# Patient Record
Sex: Male | Born: 2000 | Hispanic: Yes | Marital: Single | Smoking: Never smoker
Health system: Southern US, Community
[De-identification: ages and names within clinical notes are randomized; demographics above are authoritative.]

## PROBLEM LIST (undated history)

## (undated) HISTORY — PX: APPENDECTOMY: SHX54

---
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
## 2021-01-24 DIAGNOSIS — Z20822 Contact with and (suspected) exposure to covid-19: Secondary | ICD-10-CM | POA: Diagnosis not present

## 2024-01-07 ENCOUNTER — Ambulatory Visit
Admission: RE | Admit: 2024-01-07 | Discharge: 2024-01-07 | Disposition: A | Discharge status: Home / Self Care | Payer: Self-pay | Source: Ambulatory Visit

## 2024-01-07 VITALS — BP 111/70 | HR 62 | Temp 98.7°F | Resp 16 | Ht 67.0 in | Wt 180.0 lb

## 2024-01-07 DIAGNOSIS — L237 Allergic contact dermatitis due to plants, except food: Secondary | ICD-10-CM

## 2024-01-07 MED ORDER — TRIAMCINOLONE ACETONIDE 0.1 % EX OINT: 1.0000 | TOPICAL_OINTMENT | Freq: Two times a day (BID) | CUTANEOUS | 1 refills | Status: AC

## 2024-01-07 MED ORDER — FEXOFENADINE HCL 180 MG PO TABS: 180.0000 mg | ORAL_TABLET | Freq: Every day | ORAL | 1 refills | Status: AC

## 2024-01-07 MED ORDER — PREDNISONE 20 MG PO TABS: ORAL_TABLET | ORAL | 0 refills | Status: AC

## 2024-01-07 NOTE — ED Provider Notes (Signed)
 [facility]  Note:  This document was prepared using Dragon voice recognition software and may include unintentional dictation errors.  MRN: [id number] DOB: July 29, 2000  Subjective:   Aaron Barr is a 23 y.o. male presenting for severe pruritic rash all over body since Sunday.  Patient reports that Sunday during the day he was working on the yard and believes that he may have been exposed to poison ivy or poison oak.  Patient reports he has been using over-the-counter ointments including calamine and other over-the-counter poison ivy treatment with no improvement.  Patient reports that symptoms have continued to spread throughout the entire body.  Patient states that rash is extremely itchy but not painful.   Current Facility-Administered Medications:    hpv vaccine (GARDASIL) injection 0.5 mL, 0.5 mL, Intramuscular, Once, Jeffery, [place], PA  Current Outpatient Medications:    calamine lotion, Apply 1 Application topically as needed for itching., Disp: , Rfl:    fexofenadine  (ALLEGRA ) 180 MG tablet, Take 1 tablet (180 mg total) by mouth daily., Disp: 30 tablet, Rfl: 1   Poison Ivy Treatments (TECNU EXTREME POISON IVY & OAK EX), Apply topically., Disp: , Rfl:    predniSONE  (DELTASONE ) 20 MG tablet, Take 3 tablets (60 mg total) by mouth daily for 3 days, THEN 2 tablets (40 mg total) daily for 4 days, THEN 1 tablet (20 mg total) daily for 3 days., Disp: 20 tablet, Rfl: 0   triamcinolone  ointment (KENALOG ) 0.1 %, Apply 1 Application topically 2 (two) times daily., Disp: 30 g, Rfl: 1   folic acid-pyridoxine-cyancobalamin (FOLBIC) 2.5-25-2 MG TABS tablet, Folbic 2.5 mg-25 mg-2 mg tablet  Take 1 tablet every day by oral route for 14 days., Disp: , Rfl:    No Known Allergies  History reviewed. No pertinent past medical history.   Past Surgical History:  Procedure Laterality Date   APPENDECTOMY      History reviewed. No pertinent family history.  Social  History   Tobacco Use   Smoking status: Never   Smokeless tobacco: Never  Vaping Use   Vaping status: Never Used  Substance Use Topics   Alcohol use: No   Drug use: No    ROS Refer to HPI for ROS details.  Objective:    Vitals: BP 111/70 (BP Location: Right Arm)   Pulse 62   Temp 98.7 F (37.1 C) (Oral)   Resp 16   Ht 5' 7 (1.702 m)   Wt 180 lb (81.6 kg)   SpO2 98%   BMI 28.19 kg/m   Physical Exam Vitals and nursing note reviewed.  Constitutional:      General: He is not in acute distress.    Appearance: Normal appearance. He is well-developed. He is not ill-appearing or toxic-appearing.  HENT:     Head: Normocephalic.  Cardiovascular:     Rate and Rhythm: Normal rate.  Pulmonary:     Effort: Pulmonary effort is normal. No respiratory distress.     Breath sounds: No stridor. No wheezing.  Skin:    General: Skin is warm and dry.     Findings: Erythema and rash (Pruritic papular rash over multiple sites, erythematous, presentation strongly consistent with poison ivy dermatitis) present. Rash is papular.  Neurological:     General: No focal deficit present.     Mental Status: He is alert and oriented to person, place, and time.  Psychiatric:        Mood and Affect: Mood normal.  Behavior: Behavior normal.     Procedures  No results found for this or any previous visit (from the past 24 hours).  Assessment and Plan :     Discharge Instructions       1. Poison ivy dermatitis (Primary) - predniSONE  (DELTASONE ) 20 MG tablet; Take 3 tablets (60 mg total) by mouth daily for 3 days, THEN 2 tablets (40 mg total) daily for 4 days, THEN 1 tablet (20 mg total) daily for 3 days.  Dispense: 20 tablet; Refill: 0 - fexofenadine  (ALLEGRA ) 180 MG tablet; Take 1 tablet (180 mg total) by mouth daily.  Dispense: 30 tablet; Refill: 1 -Continue fexofenadine  for at least 5 days after finishing prednisone  taper. - triamcinolone  ointment (KENALOG ) 0.1 %; Apply 1  Application topically 2 (two) times daily.  Dispense: 30 g; Refill: 1 - Start triamcinolone  ointment in the refrigerator for soothing of itching skin. -Continue to monitor symptoms for any change in severity if there is any escalation of current symptoms or development of new symptoms follow-up in ER for further evaluation and management.     Amrit Cress B Talia Hoheisel   Brandt Chaney, [place] B, [place] 01/07/24 1920

## 2024-01-07 NOTE — ED Triage Notes (Signed)
 Poison oak allergic reaction all over the body. - Entered by patient  First noticed Sunday night after yard work on Sunday, I did not know prior to cutting/working around it that it was poison ivy/oak. I have tried Teknu, Calamine lotion, but still progressing.

## 2024-01-07 NOTE — Discharge Instructions (Addendum)
  1. Poison ivy dermatitis (Primary) - predniSONE  (DELTASONE ) 20 MG tablet; Take 3 tablets (60 mg total) by mouth daily for 3 days, THEN 2 tablets (40 mg total) daily for 4 days, THEN 1 tablet (20 mg total) daily for 3 days.  Dispense: 20 tablet; Refill: 0 - fexofenadine  (ALLEGRA ) 180 MG tablet; Take 1 tablet (180 mg total) by mouth daily.  Dispense: 30 tablet; Refill: 1 -Continue fexofenadine  for at least 5 days after finishing prednisone  taper. - triamcinolone  ointment (KENALOG ) 0.1 %; Apply 1 Application topically 2 (two) times daily.  Dispense: 30 g; Refill: 1 - Start triamcinolone  ointment in the refrigerator for soothing of itching skin. -Continue to monitor symptoms for any change in severity if there is any escalation of current symptoms or development of new symptoms follow-up in ER for further evaluation and management.

## 2024-01-08 ENCOUNTER — Ambulatory Visit: Payer: Self-pay
